# Patient Record
Sex: Male | Born: 1975 | Race: White | Hispanic: No | Marital: Single | Smoking: Former smoker
Health system: Southern US, Community
[De-identification: ages and names within clinical notes are randomized; demographics above are authoritative.]

## PROBLEM LIST (undated history)

## (undated) DIAGNOSIS — I10 Essential (primary) hypertension: Secondary | ICD-10-CM

## (undated) DIAGNOSIS — F419 Anxiety disorder, unspecified: Secondary | ICD-10-CM

## (undated) HISTORY — PX: OTHER SURGICAL HISTORY: SHX169

---

## 2013-11-04 ENCOUNTER — Other Ambulatory Visit: Payer: Self-pay | Admitting: Orthopedic Surgery

## 2013-11-06 ENCOUNTER — Encounter (HOSPITAL_BASED_OUTPATIENT_CLINIC_OR_DEPARTMENT_OTHER): Payer: Self-pay | Admitting: *Deleted

## 2013-11-14 ENCOUNTER — Ambulatory Visit (HOSPITAL_BASED_OUTPATIENT_CLINIC_OR_DEPARTMENT_OTHER)
Admission: RE | Admit: 2013-11-14 | Discharge: 2013-11-14 | Disposition: A | Discharge status: Home / Self Care | Payer: Managed Care, Other (non HMO) | Source: Ambulatory Visit | Attending: Orthopedic Surgery | Admitting: Orthopedic Surgery

## 2013-11-14 ENCOUNTER — Encounter (HOSPITAL_BASED_OUTPATIENT_CLINIC_OR_DEPARTMENT_OTHER): Payer: Managed Care, Other (non HMO) | Admitting: Anesthesiology

## 2013-11-14 ENCOUNTER — Encounter (HOSPITAL_BASED_OUTPATIENT_CLINIC_OR_DEPARTMENT_OTHER): Payer: Self-pay | Admitting: *Deleted

## 2013-11-14 ENCOUNTER — Encounter (HOSPITAL_BASED_OUTPATIENT_CLINIC_OR_DEPARTMENT_OTHER)
Admission: RE | Disposition: A | Discharge status: Home / Self Care | Payer: Self-pay | Source: Ambulatory Visit | Attending: Orthopedic Surgery

## 2013-11-14 ENCOUNTER — Ambulatory Visit (HOSPITAL_BASED_OUTPATIENT_CLINIC_OR_DEPARTMENT_OTHER): Payer: Managed Care, Other (non HMO) | Admitting: Anesthesiology

## 2013-11-14 DIAGNOSIS — Z791 Long term (current) use of non-steroidal anti-inflammatories (NSAID): Secondary | ICD-10-CM | POA: Insufficient documentation

## 2013-11-14 DIAGNOSIS — G5600 Carpal tunnel syndrome, unspecified upper limb: Secondary | ICD-10-CM | POA: Insufficient documentation

## 2013-11-14 DIAGNOSIS — Z6841 Body Mass Index (BMI) 40.0 and over, adult: Secondary | ICD-10-CM | POA: Insufficient documentation

## 2013-11-14 DIAGNOSIS — Z87891 Personal history of nicotine dependence: Secondary | ICD-10-CM | POA: Insufficient documentation

## 2013-11-14 DIAGNOSIS — F1010 Alcohol abuse, uncomplicated: Secondary | ICD-10-CM | POA: Insufficient documentation

## 2013-11-14 HISTORY — PX: CARPAL TUNNEL RELEASE: SHX101

## 2013-11-14 LAB — POCT HEMOGLOBIN-HEMACUE: HEMOGLOBIN: 16.2 g/dL (ref 13.0–17.0)

## 2013-11-14 SURGERY — CARPAL TUNNEL RELEASE
Anesthesia: Monitor Anesthesia Care | Site: Wrist | Laterality: Right

## 2013-11-14 MED ORDER — CEFAZOLIN SODIUM-DEXTROSE 2-3 GM-% IV SOLR
2.0000 g | INTRAVENOUS | Status: AC
  Administered 2013-11-14: 3 g via INTRAVENOUS

## 2013-11-14 MED ORDER — FENTANYL CITRATE 0.05 MG/ML IJ SOLN: 50.0000 ug | INTRAMUSCULAR | Status: DC | PRN

## 2013-11-14 MED ORDER — OXYCODONE HCL 5 MG/5ML PO SOLN: 5.0000 mg | Freq: Once | ORAL | Status: DC | PRN

## 2013-11-14 MED ORDER — MIDAZOLAM HCL 5 MG/5ML IJ SOLN
INTRAMUSCULAR | Status: DC | PRN
  Administered 2013-11-14: 1 mg via INTRAVENOUS

## 2013-11-14 MED ORDER — MIDAZOLAM HCL 2 MG/2ML IJ SOLN: 1.0000 mg | INTRAMUSCULAR | Status: DC | PRN

## 2013-11-14 MED ORDER — CEFAZOLIN SODIUM-DEXTROSE 2-3 GM-% IV SOLR
INTRAVENOUS | Status: AC
  Filled 2013-11-14: qty 50

## 2013-11-14 MED ORDER — BUPIVACAINE HCL (PF) 0.25 % IJ SOLN
INTRAMUSCULAR | Status: DC | PRN
  Administered 2013-11-14: 10 mL

## 2013-11-14 MED ORDER — MIDAZOLAM HCL 2 MG/2ML IJ SOLN
INTRAMUSCULAR | Status: AC
  Filled 2013-11-14: qty 2

## 2013-11-14 MED ORDER — CEFAZOLIN SODIUM 1-5 GM-% IV SOLN
INTRAVENOUS | Status: AC
  Filled 2013-11-14: qty 50

## 2013-11-14 MED ORDER — HYDROCODONE-ACETAMINOPHEN 5-325 MG PO TABS: ORAL_TABLET | ORAL | Status: DC

## 2013-11-14 MED ORDER — OXYCODONE HCL 5 MG PO TABS: 5.0000 mg | ORAL_TABLET | Freq: Once | ORAL | Status: DC | PRN

## 2013-11-14 MED ORDER — PROPOFOL 10 MG/ML IV BOLUS
INTRAVENOUS | Status: AC
  Filled 2013-11-14: qty 20

## 2013-11-14 MED ORDER — LIDOCAINE HCL (CARDIAC) 20 MG/ML IV SOLN
INTRAVENOUS | Status: DC | PRN
  Administered 2013-11-14: 30 mg via INTRAVENOUS

## 2013-11-14 MED ORDER — FENTANYL CITRATE 0.05 MG/ML IJ SOLN
INTRAMUSCULAR | Status: AC
  Filled 2013-11-14: qty 2

## 2013-11-14 MED ORDER — HYDROMORPHONE HCL PF 1 MG/ML IJ SOLN: 0.2500 mg | INTRAMUSCULAR | Status: DC | PRN

## 2013-11-14 MED ORDER — CHLORHEXIDINE GLUCONATE 4 % EX LIQD: 60.0000 mL | Freq: Once | CUTANEOUS | Status: DC

## 2013-11-14 MED ORDER — FENTANYL CITRATE 0.05 MG/ML IJ SOLN
INTRAMUSCULAR | Status: DC | PRN
  Administered 2013-11-14: 50 ug via INTRAVENOUS

## 2013-11-14 MED ORDER — LIDOCAINE HCL (PF) 0.5 % IJ SOLN
INTRAMUSCULAR | Status: DC | PRN
  Administered 2013-11-14: 30 mL via INTRAVENOUS

## 2013-11-14 MED ORDER — ONDANSETRON HCL 4 MG/2ML IJ SOLN
INTRAMUSCULAR | Status: DC | PRN
  Administered 2013-11-14: 4 mg via INTRAVENOUS

## 2013-11-14 MED ORDER — LACTATED RINGERS IV SOLN
INTRAVENOUS | Status: DC
  Administered 2013-11-14: 08:00:00 via INTRAVENOUS

## 2013-11-14 MED ORDER — PROPOFOL INFUSION 10 MG/ML OPTIME
INTRAVENOUS | Status: DC | PRN
  Administered 2013-11-14: 25 ug/kg/min via INTRAVENOUS

## 2013-11-14 SURGICAL SUPPLY — 35 items
BANDAGE ELASTIC 3 VELCRO ST LF (GAUZE/BANDAGES/DRESSINGS) ×3 IMPLANT
BLADE MINI RND TIP GREEN BEAV (BLADE) IMPLANT
BLADE SURG 15 STRL LF DISP TIS (BLADE) ×2 IMPLANT
BLADE SURG 15 STRL SS (BLADE) ×4
BNDG ESMARK 4X9 LF (GAUZE/BANDAGES/DRESSINGS) IMPLANT
BNDG GAUZE ELAST 4 BULKY (GAUZE/BANDAGES/DRESSINGS) ×3 IMPLANT
CHLORAPREP W/TINT 26ML (MISCELLANEOUS) ×3 IMPLANT
CORDS BIPOLAR (ELECTRODE) ×3 IMPLANT
COVER MAYO STAND STRL (DRAPES) ×3 IMPLANT
COVER TABLE BACK 60X90 (DRAPES) ×3 IMPLANT
CUFF TOURNIQUET SINGLE 18IN (TOURNIQUET CUFF) ×3 IMPLANT
DRAPE EXTREMITY T 121X128X90 (DRAPE) ×3 IMPLANT
DRAPE SURG 17X23 STRL (DRAPES) ×3 IMPLANT
DRSG PAD ABDOMINAL 8X10 ST (GAUZE/BANDAGES/DRESSINGS) ×3 IMPLANT
DRSG TEGADERM 2-3/8X2-3/4 SM (GAUZE/BANDAGES/DRESSINGS) ×3 IMPLANT
GAUZE SPONGE 4X4 12PLY STRL (GAUZE/BANDAGES/DRESSINGS) ×3 IMPLANT
GAUZE XEROFORM 1X8 LF (GAUZE/BANDAGES/DRESSINGS) ×3 IMPLANT
GLOVE BIO SURGEON STRL SZ7.5 (GLOVE) ×3 IMPLANT
GLOVE BIOGEL PI IND STRL 8 (GLOVE) ×1 IMPLANT
GLOVE BIOGEL PI INDICATOR 8 (GLOVE) ×2
GLOVE SURG SS PI 7.0 STRL IVOR (GLOVE) ×3 IMPLANT
GOWN STRL REUS W/ TWL LRG LVL3 (GOWN DISPOSABLE) ×1 IMPLANT
GOWN STRL REUS W/TWL LRG LVL3 (GOWN DISPOSABLE) ×2
GOWN STRL REUS W/TWL XL LVL3 (GOWN DISPOSABLE) ×3 IMPLANT
NEEDLE HYPO 25X1 1.5 SAFETY (NEEDLE) ×3 IMPLANT
NS IRRIG 1000ML POUR BTL (IV SOLUTION) ×3 IMPLANT
PACK BASIN DAY SURGERY FS (CUSTOM PROCEDURE TRAY) ×3 IMPLANT
PADDING CAST ABS 4INX4YD NS (CAST SUPPLIES) ×2
PADDING CAST ABS COTTON 4X4 ST (CAST SUPPLIES) ×1 IMPLANT
STOCKINETTE 4X48 STRL (DRAPES) ×3 IMPLANT
SUT ETHILON 4 0 PS 2 18 (SUTURE) ×3 IMPLANT
SYR BULB 3OZ (MISCELLANEOUS) ×3 IMPLANT
SYRINGE CONTROL L 12CC (SYRINGE) ×3 IMPLANT
TOWEL OR 17X24 6PK STRL BLUE (TOWEL DISPOSABLE) ×6 IMPLANT
UNDERPAD 30X30 INCONTINENT (UNDERPADS AND DIAPERS) ×3 IMPLANT

## 2013-11-14 NOTE — Discharge Instructions (Addendum)

## 2013-11-14 NOTE — Op Note (Signed)
NAME:  Ryan Bradley, Ryan Bradley                ACCOUNT NO.:  [id number]  MEDICAL RECORD NO.:  [id number]  LOCATION:                                 FACILITY:  PHYSICIAN:  Betha LoaKevin Dashawna Delbridge, MD             DATE OF BIRTH:  DATE OF PROCEDURE:  11/14/2013 DATE OF DISCHARGE:                              OPERATIVE REPORT   PREOPERATIVE DIAGNOSIS:  Right carpal tunnel syndrome.  POSTOPERATIVE DIAGNOSIS:  Right carpal tunnel syndrome.  PROCEDURE:  Right carpal tunnel release.  SURGEON:  Betha LoaKevin Machell Wirthlin, MD.  ASSISTANT:  None.  ANESTHESIA:  Bier block.  IV FLUIDS:  Per anesthesia flow sheet.  ESTIMATED BLOOD LOSS:  Minimal.  COMPLICATIONS:  None.  SPECIMENS:  None.  TOURNIQUET TIME:  29 minutes.  DISPOSITION:  Stable to PACU.  INDICATIONS:  Mr. Ryan BatonBuckner is a 38 year old male with longstanding diagnosis of carpal tunnel syndrome.  He has pins and needle sensation in his fingers.  He wishes to have a carpal tunnel release for management of symptoms.  Risks, benefits,  and alternatives of surgery were discussed including risk of blood loss, infection, damage to nerves, vessels, tendons, ligaments, bone; failure of surgery; need for additional surgery, complications with wound healing, continued pain, and continued carpal tunnel syndrome.  He voiced understanding and elected to proceed.  OPERATIVE COURSE:  After being identified preoperatively by myself, the patient and I agreed upon procedure site procedure.  Surgical site was marked.  The risks, benefits, and alternatives of the surgery were reviewed and wished to proceed.  Surgical consent had been signed.  He was given IV Ancef preoperative antibiotic prophylaxis.  He was transferred to the operating room, placed on the operating room table in supine position with the right upper extremity on arm board.  Bier block anesthesia was induced by Anesthesiologist.  Right upper extremity was prepped and draped in normal sterile orthopedic fashion.   Surgical pause was performed between surgeons, anesthesia, operating staff, and all were in agreement as to the patient, procedure, and site of procedure. A small dry wound on the dorsum of the hand was covered with an OpSite after prepping.  Incision was made over the transverse carpal ligament. This was carried into subcutaneous tissues by spreading technique. Bipolar electrocautery was used to obtain hemostasis.  The palmar fascia was sharply incised with knife.  Transverse carpal ligament was identified.  It was sharply incised.  It was incised distally first. Care was taken to ensure complete decompression distally.  It was then incised proximally.  Scissors were used to split the distal aspect of volar antebrachial fascia.  Finger was placed into the wound to ensure complete decompression which was the case.  The nerve was examined.  It was slightly flattened.  The motor branch was identified and was intact. The wound was copiously irrigated with sterile saline.  It was then closed with 4-0 nylon in a horizontal mattress fashion.  It had been injected with 10 mL of 0.25% plain Marcaine at the beginning of the case to augment the anesthesia.  The wound was dressed with sterile Xeroform, 4x4s, and ABD and wrapped with Kerlix and Ace  bandage.  Tourniquet was deflated at 29 minutes.  Fingertips were pink with brisk capillary refill after deflation of tourniquet.  Operative drapes were broken down.  The patient was awoken from anesthesia safely.  He was transferred back to stretcher and taken to PACU in stable condition.  I will see him back in the office 1 week for postoperative followup.  I will give him Norco 5/325, 1-2 p.o. q.6 hours p.r.n. pain, dispensed #30.     Betha LoaKevin Bralynn Donado, MD     KK/MEDQ  D:  11/14/2013  T:  11/14/2013  Job:  [id number]

## 2013-11-14 NOTE — Anesthesia Preprocedure Evaluation (Signed)
Anesthesia Evaluation  Patient identified by MRN, date of birth, ID band Patient awake    Reviewed: Allergy & Precautions, H&P , NPO status , Patient's Chart, lab work & pertinent test results  Airway Mallampati: III TM Distance: >3 FB Neck ROM: Full    Dental no notable dental hx. (+) Teeth Intact, Dental Advisory Given   Pulmonary neg pulmonary ROS, former smoker,  breath sounds clear to auscultation  Pulmonary exam normal       Cardiovascular negative cardio ROS  Rhythm:Regular Rate:Normal     Neuro/Psych negative neurological ROS  negative psych ROS   GI/Hepatic negative GI ROS, Neg liver ROS,   Endo/Other  Morbid obesity  Renal/GU negative Renal ROS  negative genitourinary   Musculoskeletal   Abdominal   Peds  Hematology negative hematology ROS (+)   Anesthesia Other Findings   Reproductive/Obstetrics negative OB ROS                           Anesthesia Physical Anesthesia Plan  ASA: III  Anesthesia Plan: MAC and Bier Block   Post-op Pain Management:    Induction: Intravenous  Airway Management Planned: Simple Face Mask  Additional Equipment:   Intra-op Plan:   Post-operative Plan: Extubation in OR  Informed Consent: I have reviewed the patients History and Physical, chart, labs and discussed the procedure including the risks, benefits and alternatives for the proposed anesthesia with the patient or authorized representative who has indicated his/her understanding and acceptance.   Dental advisory given  Plan Discussed with: CRNA  Anesthesia Plan Comments:         Anesthesia Quick Evaluation

## 2013-11-14 NOTE — Op Note (Signed)
[id number] 

## 2013-11-14 NOTE — Brief Op Note (Signed)
11/14/2013  10:14 AM  PATIENT:  Erma HeritageJohn B Speas  38 y.o. male  PRE-OPERATIVE DIAGNOSIS:  RIGHT CARPAL TUNNEL SYNDROME  POST-OPERATIVE DIAGNOSIS:  Right Carpal tunnel syndrome  PROCEDURE:  Procedure(s): RIGHT CARPAL TUNNEL RELEASE (Right)  SURGEON:  Surgeon(s) and Role:    * Tami RibasKevin R Lorri Fukuhara, MD - Primary  PHYSICIAN ASSISTANT:   ASSISTANTS: none   ANESTHESIA:   Bier block  EBL:     BLOOD ADMINISTERED:none  DRAINS: none   LOCAL MEDICATIONS USED:  MARCAINE     SPECIMEN:  No Specimen  DISPOSITION OF SPECIMEN:  N/A  COUNTS:  YES  TOURNIQUET:   Total Tourniquet Time Documented: Forearm (Right) - 29 minutes Total: Forearm (Right) - 29 minutes   DICTATION: .Other Dictation: Dictation Number [id number]  PLAN OF CARE: Discharge to home after PACU  PATIENT DISPOSITION:  PACU - hemodynamically stable.

## 2013-11-14 NOTE — Anesthesia Procedure Notes (Signed)
Anesthesia Regional Block:  Bier block (IV Regional)  Pre-Anesthetic Checklist: ,, timeout performed, Correct Patient, Correct Site, Correct Laterality, Correct Procedure,, site marked, surgical consent,, at surgeon's request Needles:  Injection technique: Single-shot  Needle Type: Other      Needle Gauge: 20 and 20 G    Additional Needles: Bier block (IV Regional) Narrative:   Performed by: Personally    Procedure Name: MAC Date/Time: 11/14/2013 9:30 AM Performed by: Catha Ontko Pre-anesthesia Checklist: Patient identified, Emergency Drugs available, Suction available, Patient being monitored and Timeout performed Patient Re-evaluated:Patient Re-evaluated prior to inductionOxygen Delivery Method: Simple face mask

## 2013-11-14 NOTE — Transfer of Care (Signed)
Immediate Anesthesia Transfer of Care Note  Patient: Erma HeritageJohn B Lahti  Procedure(s) Performed: Procedure(s): RIGHT CARPAL TUNNEL RELEASE (Right)  Patient Location: PACU  Anesthesia Type:Bier block  Level of Consciousness: awake, alert , oriented and patient cooperative  Airway & Oxygen Therapy: Patient Spontanous Breathing and Patient connected to face mask oxygen  Post-op Assessment: Report given to PACU RN and Post -op Vital signs reviewed and stable  Post vital signs: Reviewed and stable  Complications: No apparent anesthesia complications

## 2013-11-14 NOTE — Anesthesia Postprocedure Evaluation (Signed)
  Anesthesia Post-op Note  Patient: Ryan HeritageJohn B Bradley  Procedure(s) Performed: Procedure(s): RIGHT CARPAL TUNNEL RELEASE (Right)  Patient Location: PACU  Anesthesia Type: MAC and Bier block  Level of Consciousness: awake and alert   Airway and Oxygen Therapy: Patient Spontanous Breathing  Post-op Pain: none  Post-op Assessment: Post-op Vital signs reviewed, Patient's Cardiovascular Status Stable and Respiratory Function Stable  Post-op Vital Signs: Reviewed  Filed Vitals:   11/14/13 1059  BP: 141/98  Pulse: 63  Temp: 36.3 C  Resp: 22    Complications: No apparent anesthesia complications

## 2013-11-14 NOTE — H&P (Signed)
  Ryan Bradley is an 38 y.o. Bradley.   Chief Complaint: carpal tunnel HPI: Ryan Bradley with 15+ years carpal tunnel syndrome.  Tingling in thumb, index, long fingers.  Worse when riding motorcycle.  Positive nerve conduction studies.  He wishes to have a carpal tunnel release to manage symptoms.   History reviewed. No pertinent past medical history.  Past Surgical History  Procedure Laterality Date  . Reconstructive surgery  on left hand      2000 [facility]  . Left knee arthroscopy      2006 [facility]    History reviewed. No pertinent family history. Social History:  reports that he quit smoking about 3 months ago. He does not have any smokeless tobacco history on file. He reports that he drinks alcohol. He reports that he does not use illicit drugs.  Allergies:  Allergies  Allergen Reactions  . Other Nausea And Vomiting    Oysters    Medications Prior to Admission  Medication Sig Dispense Refill  . naproxen sodium (ANAPROX) 220 MG tablet Take 220 mg by mouth 3 (three) times daily.        No results found for this or any previous visit (from the past 48 hour(s)).  No results found.   A comprehensive review of systems was negative except for: Eyes: positive for contacts/glasses  Height 6\' 1"  (1.854 m), weight 149.687 kg (330 lb).  General appearance: alert, cooperative and appears stated age Head: Normocephalic, without obvious abnormality, atraumatic Neck: supple, symmetrical, trachea midline Resp: clear to auscultation bilaterally Cardio: regular rate and rhythm GI: non tender Extremities: intact sensation and capillary refill all digits.  +epl/fpl/io.  no wounds. Pulses: 2+ and symmetric Skin: Skin color, texture, turgor normal. No rashes or lesions Neurologic: Grossly normal Incision/Wound: none  Assessment/Plan Carpal tunnel syndrome.  Plan right carpal tunnel release.  Non operative and operative treatment options were discussed with the patient and  patient wishes to proceed with operative treatment. Risks, benefits, and alternatives of surgery were discussed and the patient agrees with the plan of care.   Ryan Bradley R 11/14/2013, 7:29 AM

## 2013-11-18 ENCOUNTER — Encounter (HOSPITAL_BASED_OUTPATIENT_CLINIC_OR_DEPARTMENT_OTHER): Payer: Self-pay | Admitting: Orthopedic Surgery

## 2015-08-21 ENCOUNTER — Encounter (HOSPITAL_COMMUNITY): Payer: Self-pay

## 2015-08-21 ENCOUNTER — Emergency Department (HOSPITAL_COMMUNITY)
Admission: EM | Admit: 2015-08-21 | Discharge: 2015-08-21 | Disposition: A | Discharge status: Home / Self Care | Payer: BLUE CROSS/BLUE SHIELD | Attending: Emergency Medicine | Admitting: Emergency Medicine

## 2015-08-21 ENCOUNTER — Emergency Department (HOSPITAL_COMMUNITY): Payer: BLUE CROSS/BLUE SHIELD

## 2015-08-21 DIAGNOSIS — Z87891 Personal history of nicotine dependence: Secondary | ICD-10-CM | POA: Insufficient documentation

## 2015-08-21 DIAGNOSIS — Z79899 Other long term (current) drug therapy: Secondary | ICD-10-CM | POA: Diagnosis not present

## 2015-08-21 DIAGNOSIS — R079 Chest pain, unspecified: Secondary | ICD-10-CM | POA: Insufficient documentation

## 2015-08-21 HISTORY — DX: Anxiety disorder, unspecified: F41.9

## 2015-08-21 LAB — CBC WITH DIFFERENTIAL/PLATELET
Basophils Absolute: 0 10*3/uL (ref 0.0–0.1)
Basophils Relative: 1 %
Eosinophils Absolute: 0.1 10*3/uL (ref 0.0–0.7)
Eosinophils Relative: 1 %
HCT: 46.9 % (ref 39.0–52.0)
Hemoglobin: 15.8 g/dL (ref 13.0–17.0)
Lymphocytes Relative: 24 %
Lymphs Abs: 1.9 10*3/uL (ref 0.7–4.0)
MCH: 29.4 pg (ref 26.0–34.0)
MCHC: 33.7 g/dL (ref 30.0–36.0)
MCV: 87.3 fL (ref 78.0–100.0)
Monocytes Absolute: 0.8 10*3/uL (ref 0.1–1.0)
Monocytes Relative: 11 %
Neutro Abs: 5 10*3/uL (ref 1.7–7.7)
Neutrophils Relative %: 63 %
Platelets: 239 10*3/uL (ref 150–400)
RBC: 5.37 MIL/uL (ref 4.22–5.81)
RDW: 13.4 % (ref 11.5–15.5)
WBC: 7.7 10*3/uL (ref 4.0–10.5)

## 2015-08-21 LAB — HEPATIC FUNCTION PANEL
ALT: 38 U/L (ref 17–63)
AST: 25 U/L (ref 15–41)
Albumin: 4.3 g/dL (ref 3.5–5.0)
Alkaline Phosphatase: 80 U/L (ref 38–126)
BILIRUBIN DIRECT: 0.1 mg/dL (ref 0.1–0.5)
BILIRUBIN INDIRECT: 0.4 mg/dL (ref 0.3–0.9)
TOTAL PROTEIN: 7.5 g/dL (ref 6.5–8.1)
Total Bilirubin: 0.5 mg/dL (ref 0.3–1.2)

## 2015-08-21 LAB — BASIC METABOLIC PANEL
Anion gap: 7 (ref 5–15)
BUN: 19 mg/dL (ref 6–20)
CALCIUM: 9 mg/dL (ref 8.9–10.3)
CHLORIDE: 98 mmol/L — AB (ref 101–111)
CO2: 30 mmol/L (ref 22–32)
CREATININE: 1.54 mg/dL — AB (ref 0.61–1.24)
GFR calc non Af Amer: 55 mL/min — ABNORMAL LOW (ref >60)
Glucose, Bld: 90 mg/dL (ref 65–99)
Potassium: 4.5 mmol/L (ref 3.5–5.1)
Sodium: 135 mmol/L (ref 135–145)

## 2015-08-21 LAB — TROPONIN I
Troponin I: <0.03 ng/mL
Troponin I: <0.03 ng/mL

## 2015-08-21 MED ORDER — HYDROMORPHONE HCL 1 MG/ML IJ SOLN: 1.0000 mg | Freq: Once | INTRAMUSCULAR | Status: DC

## 2015-08-21 MED ORDER — NITROGLYCERIN 0.4 MG SL SUBL
0.4000 mg | SUBLINGUAL_TABLET | Freq: Once | SUBLINGUAL | Status: AC
  Administered 2015-08-21: 0.4 mg via SUBLINGUAL
  Filled 2015-08-21: qty 1

## 2015-08-21 MED ORDER — HYDROCODONE-ACETAMINOPHEN 5-325 MG PO TABS: 1.0000 | ORAL_TABLET | Freq: Four times a day (QID) | ORAL | Status: DC | PRN

## 2015-08-21 MED ORDER — KETOROLAC TROMETHAMINE 30 MG/ML IJ SOLN: 30.0000 mg | Freq: Once | INTRAMUSCULAR | Status: DC

## 2015-08-21 MED ORDER — ONDANSETRON HCL 4 MG/2ML IJ SOLN
4.0000 mg | Freq: Once | INTRAMUSCULAR | Status: DC
Start: 2015-08-21 — End: 2015-08-21

## 2015-08-21 MED ORDER — OXYCODONE-ACETAMINOPHEN 5-325 MG PO TABS
1.0000 | ORAL_TABLET | Freq: Once | ORAL | Status: AC
  Administered 2015-08-21: 1 via ORAL
  Filled 2015-08-21: qty 1

## 2015-08-21 MED ORDER — LORAZEPAM 1 MG PO TABS
1.0000 mg | ORAL_TABLET | Freq: Once | ORAL | Status: AC
  Administered 2015-08-21: 1 mg via ORAL
  Filled 2015-08-21: qty 1

## 2015-08-21 NOTE — ED Notes (Signed)
Nitro SL did not relief chest pain,  Dr Estell HarpinZammit notified.  Pt did c/o headache from for nitro SL, see new order.

## 2015-08-21 NOTE — ED Provider Notes (Signed)
CSN: [id number]     Arrival date & time 08/21/15  1537 History   First MD Initiated Contact with Patient 08/21/15 1548     Chief Complaint  Patient presents with  . Chest Pain     (Consider location/radiation/quality/duration/timing/severity/associated sxs/prior Treatment) Patient is a 40 y.o. male presenting with chest pain. The history is provided by the patient (Patient complains of some chest tightness for over a day with pain in his left arm not related to exertion.).  Chest Pain Pain location:  L chest Pain quality: aching   Pain radiates to:  Does not radiate Pain radiates to the back: no   Pain severity:  Moderate Onset quality:  Gradual Timing:  Intermittent Progression:  Waxing and waning Chronicity:  New Context: not breathing   Associated symptoms: no abdominal pain, no back pain, no cough, no fatigue and no headache     Past Medical History  Diagnosis Date  . Anxiety    Past Surgical History  Procedure Laterality Date  . Reconstructive surgery  on left hand      2000 [facility]  . Left knee arthroscopy      2006 [facility]  . Carpal tunnel release Right 11/14/2013    Procedure: RIGHT CARPAL TUNNEL RELEASE;  Surgeon: Tami Ribas, MD;  Location: [facility];  Service: Orthopedics;  Laterality: Right;   No family history on file. Social History  Substance Use Topics  . Smoking status: Former Smoker    Quit date: 07/27/2013  . Smokeless tobacco: None     Comment: Smoked a pack a a day  . Alcohol Use: Yes     Comment: Once a week - beer    Review of Systems  Constitutional: Negative for appetite change and fatigue.  HENT: Negative for congestion, ear discharge and sinus pressure.   Eyes: Negative for discharge.  Respiratory: Negative for cough.   Cardiovascular: Positive for chest pain.  Gastrointestinal: Negative for abdominal pain and diarrhea.  Genitourinary: Negative for frequency and hematuria.  Musculoskeletal: Negative for back  pain.  Skin: Negative for rash.  Neurological: Negative for seizures and headaches.  Psychiatric/Behavioral: Negative for hallucinations.      Allergies  Isotretinoin and Other  Home Medications   Prior to Admission medications   Medication Sig Start Date End Date Taking? Authorizing Provider  naproxen sodium (ANAPROX) 220 MG tablet Take 440 mg by mouth every morning.    Yes Historical Provider, MD  sertraline (ZOLOFT) 100 MG tablet Take 100 mg by mouth daily. 06/17/15  Yes Historical Provider, MD  HYDROcodone-acetaminophen (NORCO/VICODIN) 5-325 MG tablet Take 1 tablet by mouth every 6 (six) hours as needed. 08/21/15   Bethann Berkshire, MD   BP 105/55 mmHg  Pulse 56  Temp(Src) 98.2 F (36.8 C) (Oral)  Resp 14  Ht  (1.88 m)  Wt 350 lb (158.759 kg)  BMI 44.92 kg/m2  SpO2 99% Physical Exam  Constitutional: He is oriented to person, place, and time. He appears well-developed.  HENT:  Head: Normocephalic.  Eyes: Conjunctivae and EOM are normal. No scleral icterus.  Neck: Neck supple. No thyromegaly present.  Cardiovascular: Normal rate and regular rhythm.  Exam reveals no gallop and no friction rub.   No murmur heard. Pulmonary/Chest: No stridor. He has no wheezes. He has no rales. He exhibits no tenderness.  Abdominal: He exhibits no distension. There is no tenderness. There is no rebound.  Musculoskeletal: Normal range of motion. He exhibits no edema.  Lymphadenopathy:    He has no cervical adenopathy.  Neurological: He is oriented to person, place, and time. He exhibits normal muscle tone. Coordination normal.  Skin: No rash noted. No erythema.  Psychiatric: He has a normal mood and affect. His behavior is normal.    ED Course  Procedures (including critical care time) Labs Review Labs Reviewed  BASIC METABOLIC PANEL - Abnormal; Notable for the following:    Chloride 98 (*)    Creatinine, Ser 1.54 (*)    GFR calc non Af Amer 55 (*)    All other components within  normal limits  TROPONIN I  CBC WITH DIFFERENTIAL/PLATELET  HEPATIC FUNCTION PANEL  TROPONIN I    Imaging Review Dg Chest 2 View  08/21/2015  CLINICAL DATA:  Chest pain and left arm tingling beginning this morning. No known injury. Initial encounter. EXAM: CHEST  2 VIEW COMPARISON:  None. FINDINGS: The lungs are clear. Heart size is normal. No pneumothorax or pleural effusion. No focal bony abnormality. IMPRESSION: Negative chest. Electronically Signed   By: Drusilla Kannerhomas  Dalessio M.D.   On: 08/21/2015 16:36   I have personally reviewed and evaluated these images and lab results as part of my medical decision-making.   EKG Interpretation   Date/Time:  Friday August 21 2015 20:03:45 EDT Ventricular Rate:  59 PR Interval:  185 QRS Duration: 94 QT Interval:  430 QTC Calculation: 426 R Axis:   101 Text Interpretation:  Sinus rhythm Right axis deviation Low voltage,  precordial leads Confirmed by Ashutosh Dieguez  MD, Aileene Lanum ([id number]) on 08/21/2015  8:18:29 PM      MDM   Final diagnoses:  Chest pain at rest    Labs unremarkable including 2 troponins that were negative. 2 EKGs were normal. Doubt coronary artery disease causing chest pain. Patient referred to a family doctor and to cardiology for further workup. He has a significant family history his brother had an recent bypass    Bethann BerkshireJoseph Arabelle Bollig, MD 08/21/15 2028

## 2015-08-21 NOTE — ED Notes (Addendum)
Patient reports of left sided chest pain with tingling in left arm that started while driving home from work last night.

## 2015-08-21 NOTE — Discharge Instructions (Signed)
Follow up with the cardiologist in 1-2 weeks and follow up with a family md,   Take one baby aspirin a day until you see the cardiologist

## 2015-08-21 NOTE — ED Notes (Signed)
Patient given discharge instruction, verbalized understand. IV removed, band aid applied. Patient ambulatory out of the department.  

## 2017-09-26 IMAGING — DX DG CHEST 2V
3 series · 3 of 3 positions shown · non-contrast
Comparison: None.

CLINICAL DATA: Chest pain and left arm tingling beginning this
morning. No known injury. Initial encounter.

EXAM:
CHEST  2 VIEW

[chest pa (1 of 2)]
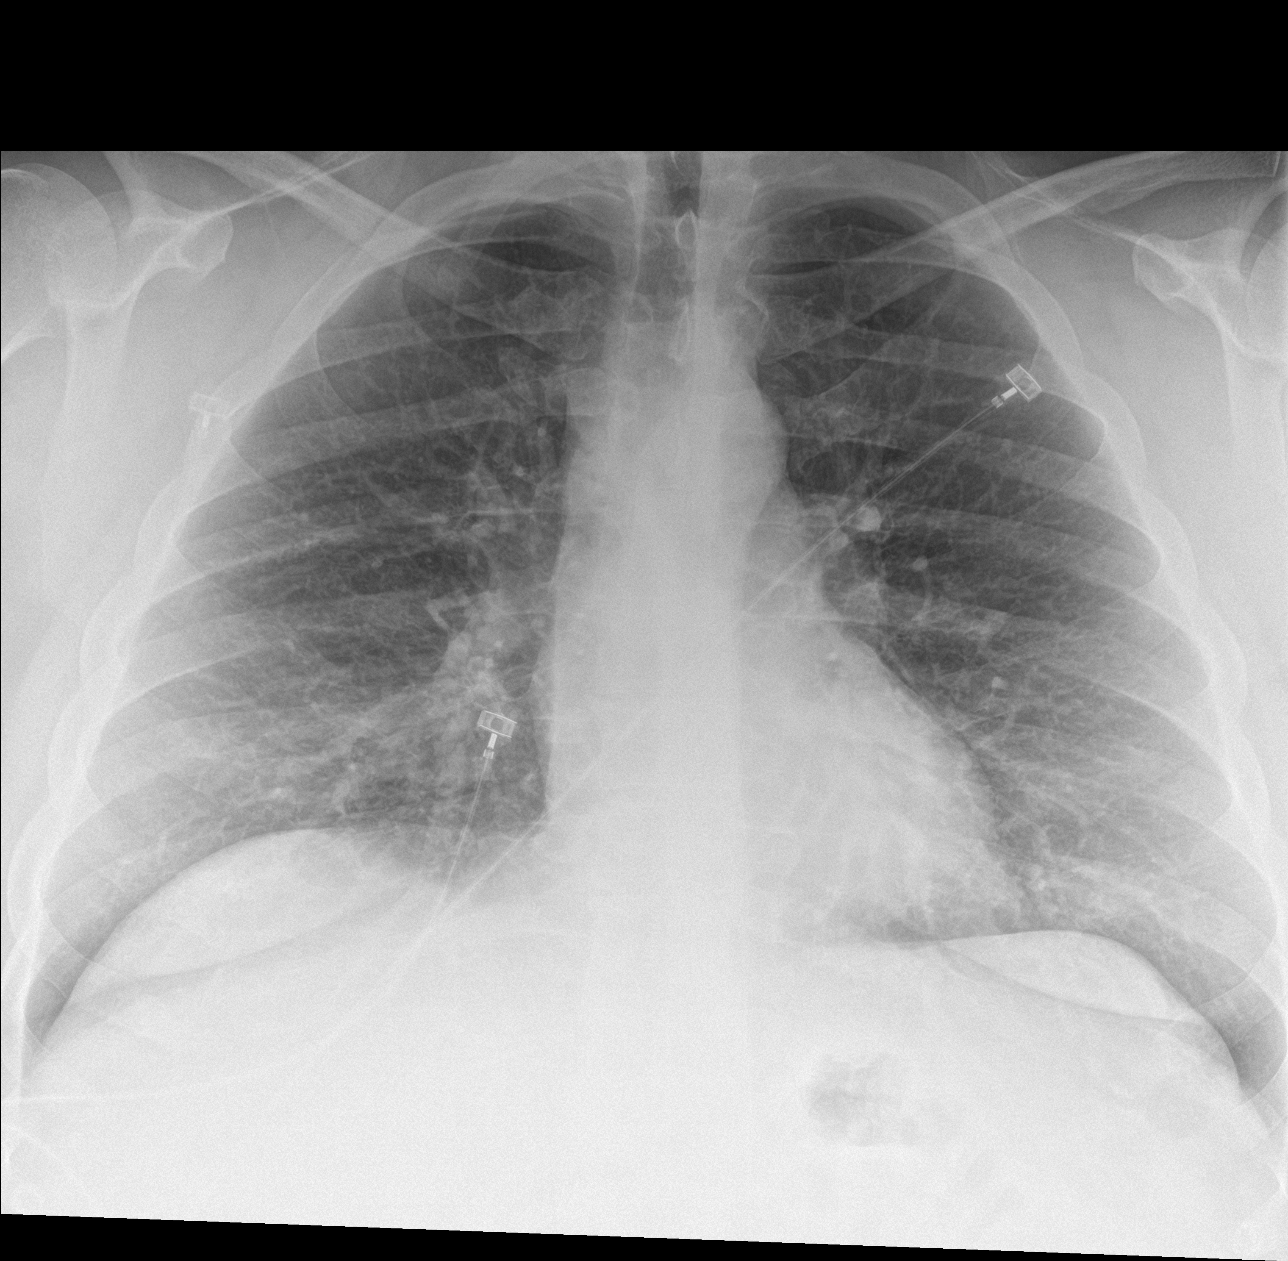

[chest lat]
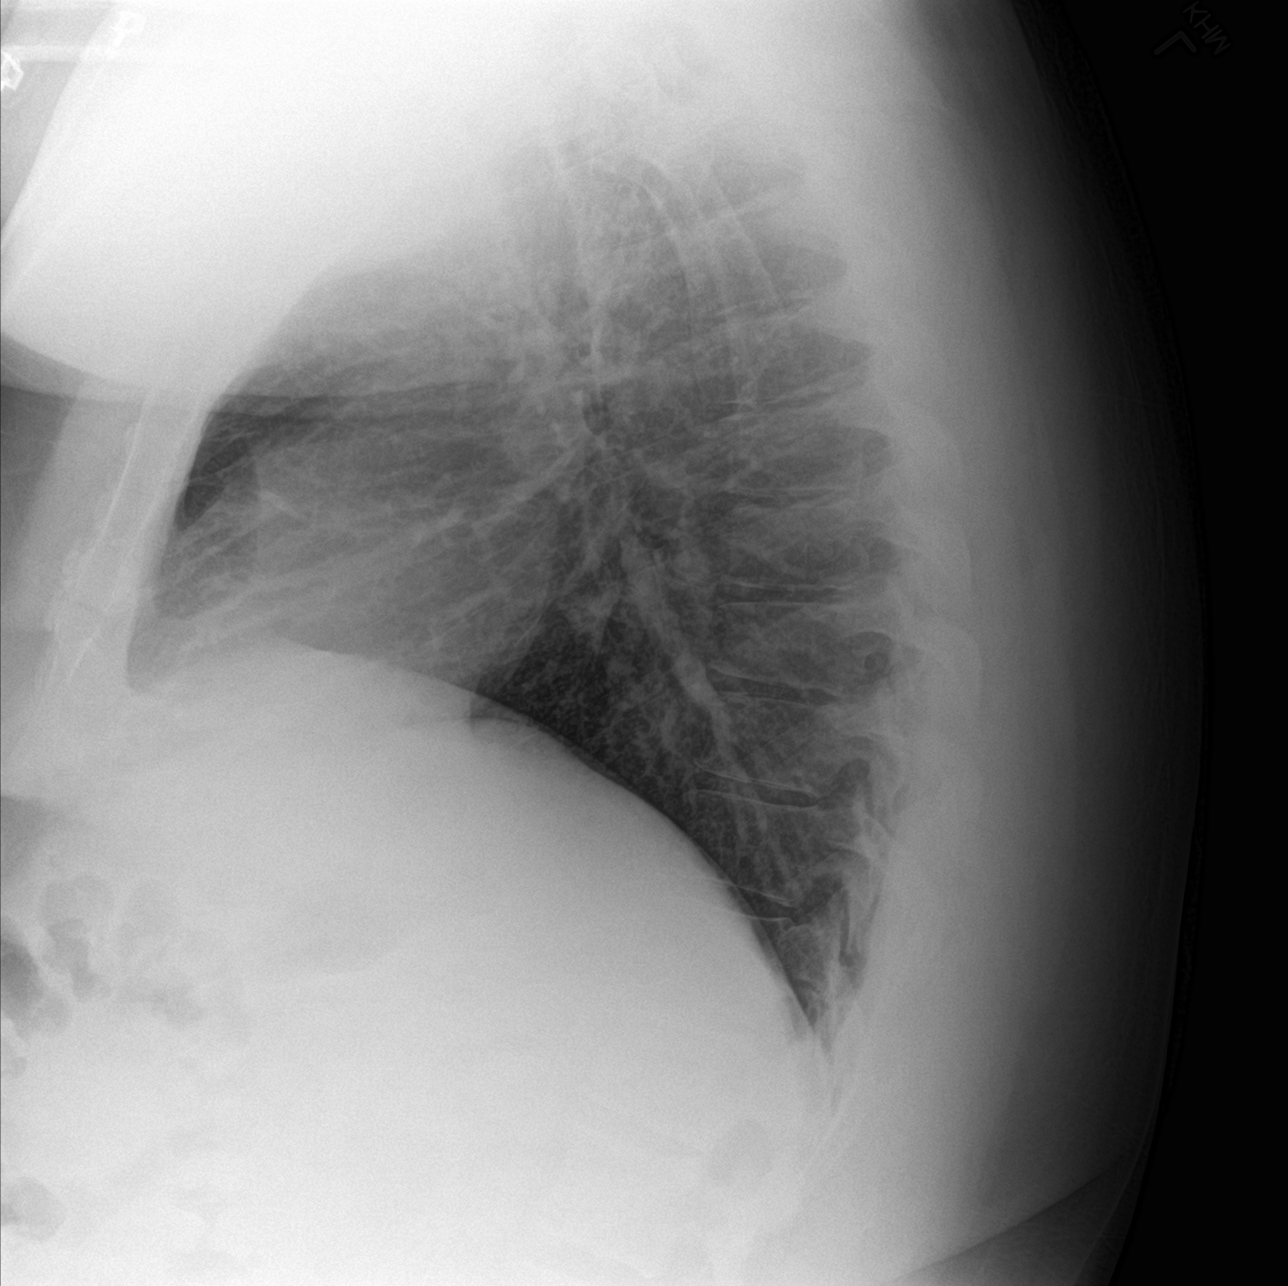

[chest pa (2 of 2)]
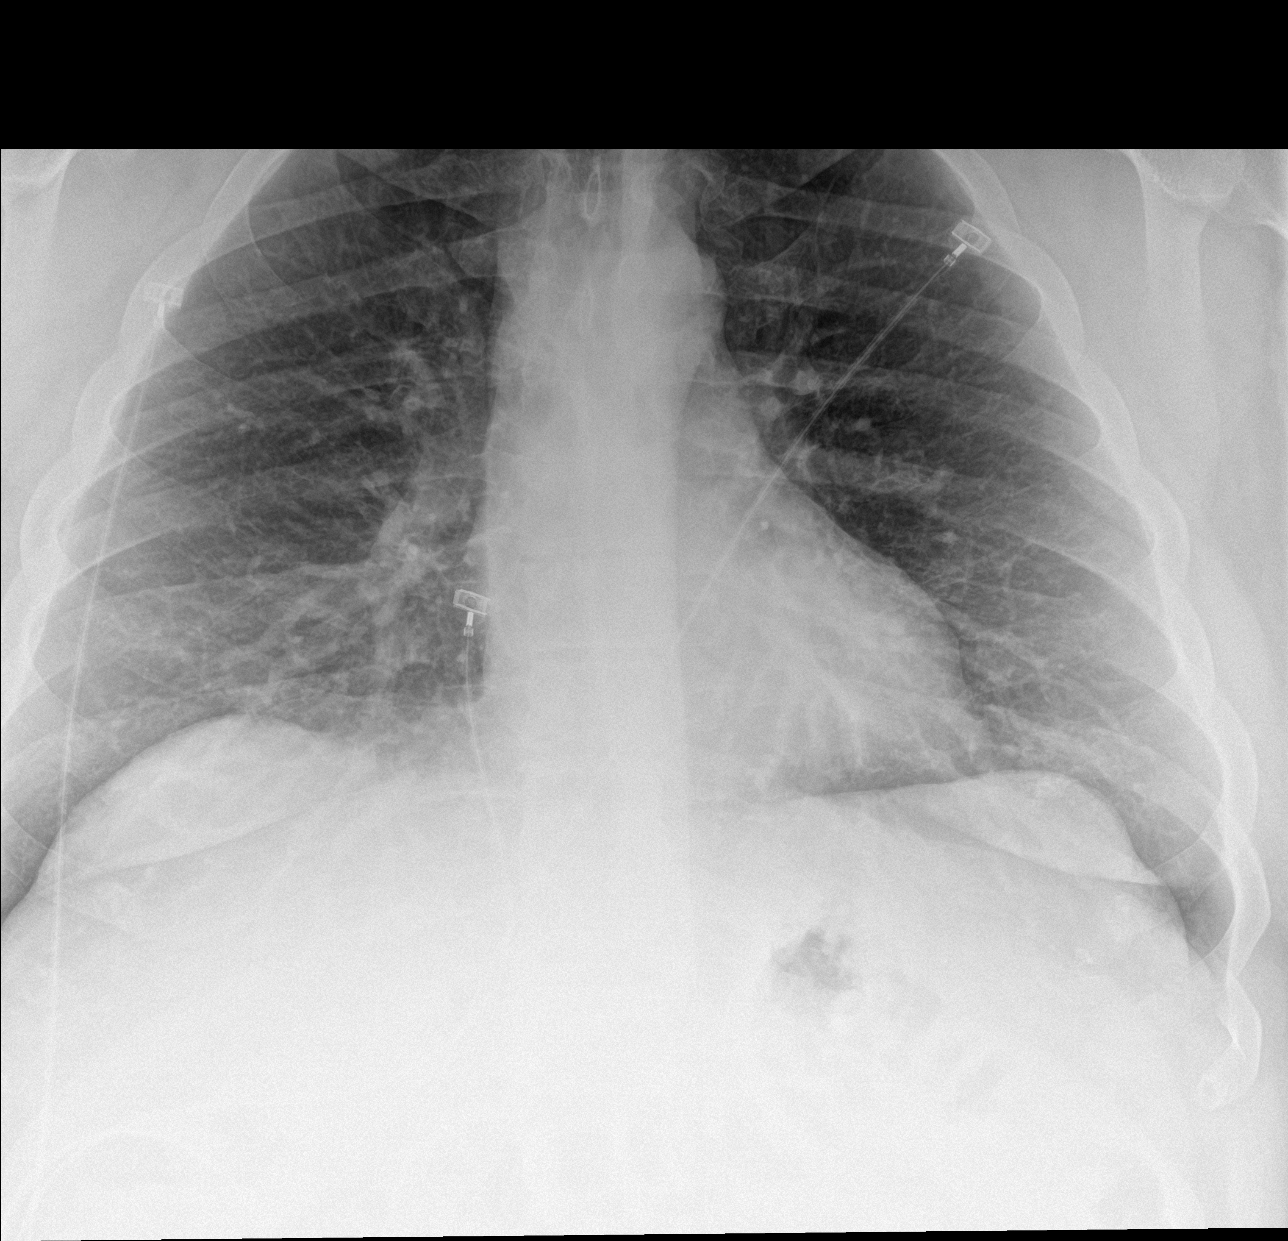

[3 of 3 positions shown; findings below may reference images not displayed]

FINDINGS: The lungs are clear. Heart size is normal. No pneumothorax or
pleural effusion. No focal bony abnormality.
IMPRESSION: Negative chest.

## 2018-04-24 ENCOUNTER — Encounter (HOSPITAL_COMMUNITY): Payer: Self-pay | Admitting: *Deleted

## 2018-04-24 ENCOUNTER — Other Ambulatory Visit: Payer: Self-pay

## 2018-04-24 ENCOUNTER — Emergency Department (HOSPITAL_COMMUNITY)
Admission: EM | Admit: 2018-04-24 | Discharge: 2018-04-24 | Disposition: A | Discharge status: Home / Self Care | Payer: BLUE CROSS/BLUE SHIELD | Attending: Emergency Medicine | Admitting: Emergency Medicine

## 2018-04-24 DIAGNOSIS — Z87891 Personal history of nicotine dependence: Secondary | ICD-10-CM | POA: Insufficient documentation

## 2018-04-24 DIAGNOSIS — R42 Dizziness and giddiness: Secondary | ICD-10-CM | POA: Insufficient documentation

## 2018-04-24 DIAGNOSIS — I10 Essential (primary) hypertension: Secondary | ICD-10-CM | POA: Insufficient documentation

## 2018-04-24 HISTORY — DX: Essential (primary) hypertension: I10

## 2018-04-24 LAB — BASIC METABOLIC PANEL
ANION GAP: 8 (ref 5–15)
BUN: 12 mg/dL (ref 6–20)
CHLORIDE: 102 mmol/L (ref 98–111)
CO2: 27 mmol/L (ref 22–32)
Calcium: 8.9 mg/dL (ref 8.9–10.3)
Creatinine, Ser: 1.1 mg/dL (ref 0.61–1.24)
GFR calc non Af Amer: >60 mL/min (ref >60)
Glucose, Bld: 124 mg/dL — ABNORMAL HIGH (ref 70–99)
POTASSIUM: 3.9 mmol/L (ref 3.5–5.1)
SODIUM: 137 mmol/L (ref 135–145)

## 2018-04-24 LAB — CBC WITH DIFFERENTIAL/PLATELET
Abs Immature Granulocytes: 0.05 10*3/uL (ref 0.00–0.07)
BASOS ABS: 0.1 10*3/uL (ref 0.0–0.1)
Basophils Relative: 1 %
EOS PCT: 0 %
Eosinophils Absolute: 0 10*3/uL (ref 0.0–0.5)
HEMATOCRIT: 42.7 % (ref 39.0–52.0)
HEMOGLOBIN: 13.9 g/dL (ref 13.0–17.0)
IMMATURE GRANULOCYTES: 1 %
LYMPHS ABS: 1.2 10*3/uL (ref 0.7–4.0)
Lymphocytes Relative: 17 %
MCH: 29.1 pg (ref 26.0–34.0)
MCHC: 32.6 g/dL (ref 30.0–36.0)
MCV: 89.3 fL (ref 80.0–100.0)
Monocytes Absolute: 0.6 10*3/uL (ref 0.1–1.0)
Monocytes Relative: 8 %
NRBC: 0 % (ref 0.0–0.2)
Neutro Abs: 5.3 10*3/uL (ref 1.7–7.7)
Neutrophils Relative %: 73 %
Platelets: 206 10*3/uL (ref 150–400)
RBC: 4.78 MIL/uL (ref 4.22–5.81)
RDW: 13.2 % (ref 11.5–15.5)
WBC: 7.2 10*3/uL (ref 4.0–10.5)

## 2018-04-24 LAB — CBG MONITORING, ED: Glucose-Capillary: 148 mg/dL — ABNORMAL HIGH (ref 70–99)

## 2018-04-24 LAB — TROPONIN I

## 2018-04-24 NOTE — ED Provider Notes (Signed)
[facility] EMERGENCY DEPARTMENT Provider Note   CSN: [id number] Arrival date & time: 04/24/18  0414     History   Chief Complaint Chief Complaint  Patient presents with  . Hypertension    HPI Ryan HeritageJohn B Bradley is a 42 y.o. male.  HPI  This a 42 year old male who presents with lightheadedness and high blood pressure.  Patient reports that he was taking a break while at work.  He had sudden onset of lightheadedness and dizziness.  He took his blood pressure and it was over 200 systolic.  He does take blood pressure medications and reports compliance.  He did not experience any chest pain, shortness of breath, headache, weakness, numbness, tingling, strokelike symptoms during this time.  He describes the lightheadedness as off-balance.  He denies any room spinning dizziness.  Denies any significant exertion prior to this.  Reports that he feels that he is hydrated.  Currently he states he has some mild dizziness but otherwise is back to baseline.  Past Medical History:  Diagnosis Date  . Anxiety   . Hypertension     There are no active problems to display for this patient.   Past Surgical History:  Procedure Laterality Date  . CARPAL TUNNEL RELEASE Right 11/14/2013   Procedure: RIGHT CARPAL TUNNEL RELEASE;  Surgeon: Tami RibasKevin R Kuzma, MD;  Location: [facility];  Service: Orthopedics;  Laterality: Right;  . left knee Arthroscopy     2006 [facility] [place]  . Reconstructive surgery  on left hand     2000 - [facility]  . scalp surgery          Home Medications    Prior to Admission medications   Medication Sig Start Date End Date Taking? Authorizing Provider  HYDROcodone-acetaminophen (NORCO/VICODIN) 5-325 MG tablet Take 1 tablet by mouth every 6 (six) hours as needed. 08/21/15   Bethann BerkshireZammit, Joseph, MD  naproxen sodium (ANAPROX) 220 MG tablet Take 440 mg by mouth every morning.     [provider]  sertraline (ZOLOFT) 100 MG tablet Take 100 mg by mouth daily.  06/17/15   [provider]    Family History History reviewed. No pertinent family history.  Social History Social History   Tobacco Use  . Smoking status: Former Smoker    Last attempt to quit: 07/27/2013    Years since quitting: 4.7  . Smokeless tobacco: Never Used  . Tobacco comment: Smoked a pack a a day  Substance Use Topics  . Alcohol use: Yes    Comment: Once a week - beer  . Drug use: No     Allergies   Isotretinoin and Other   Review of Systems Review of Systems  Constitutional: Negative for fever.  Respiratory: Negative for shortness of breath.   Cardiovascular: Negative for chest pain.  Gastrointestinal: Negative for abdominal pain, nausea and vomiting.  Genitourinary: Negative for dysuria.  Neurological: Positive for dizziness and light-headedness. Negative for speech difficulty and weakness.  All other systems reviewed and are negative.    Physical Exam Updated Vital Signs BP (!) 152/94 (BP Location: Right Arm)   Pulse 64   Temp 97.9 F (36.6 C) (Oral)   Resp 16   Ht 1.88 m (6\' 2" )   Wt (!) 179.2 kg   SpO2 96%   BMI 50.71 kg/m   Physical Exam  Constitutional: He is oriented to person, place, and time. He appears well-developed and well-nourished.  Morbidly obese  HENT:  Head: Normocephalic and atraumatic.  Eyes: Pupils are equal, round, and reactive to light. EOM are normal.  Neck: Neck supple.  Cardiovascular: Normal rate, regular rhythm and normal heart sounds.  No murmur heard. Pulmonary/Chest: Effort normal and breath sounds normal. No respiratory distress. He has no wheezes.  Abdominal: Soft. Bowel sounds are normal. There is no tenderness. There is no rebound.  Musculoskeletal: He exhibits no edema.  Neurological: He is alert and oriented to person, place, and time.  Cranial nerves II through XII intact, fluent speech, 5 out of 5 strength in all 4 extremities, no dysmetria to finger-nose-finger  Skin: Skin is warm and dry. He  is not diaphoretic.  Psychiatric: He has a normal mood and affect.  Nursing note and vitals reviewed.    ED Treatments / Results  Labs (all labs ordered are listed, but only abnormal results are displayed) Labs Reviewed  BASIC METABOLIC PANEL - Abnormal; Notable for the following components:      Result Value   Glucose, Bld 124 (*)    All other components within normal limits  CBG MONITORING, ED - Abnormal; Notable for the following components:   Glucose-Capillary 148 (*)    All other components within normal limits  CBC WITH DIFFERENTIAL/PLATELET  TROPONIN I    EKG EKG Interpretation  Date/Time:  Tuesday April 24 2018 05:08:54 EST Ventricular Rate:  61 PR Interval:    QRS Duration: 93 QT Interval:  406 QTC Calculation: 409 R Axis:   75 Text Interpretation:  Sinus rhythm Low voltage, precordial leads Confirmed by Ross Marcus ([id number]) on 04/24/2018 5:12:02 AM   Radiology No results found.  Procedures Procedures (including critical care time)  Medications Ordered in ED Medications - No data to display   Initial Impression / Assessment and Plan / ED Course  I have reviewed the triage vital signs and the nursing notes.  Pertinent labs & imaging results that were available during my care of the patient were reviewed by me and considered in my medical decision making (see chart for details).     Patient presents with lightheadedness and hypertension.  Symptoms have improved and his blood pressure has down trended.  He is nontoxic-appearing.  Vital signs notable for blood pressure of 152/94.  He has no signs or symptoms of cerebellar dysfunction and his neurologic exam is benign.  He has not had any chest pain or shortness of breath.  EKG shows no evidence of arrhythmia or ischemia.  He is not orthostatic.  CBG is 148.  Patient monitored closely.  Other lab work is largely reassuring.  He has remained hemodynamically stable while in the emergency room.  Unclear  etiology of the symptoms although a sudden spike of his blood pressure could have caused dizziness.  At this time he does not appear to have an acute emergent process.  Doubt ACS or arrhythmia as the cause.  However, would have him follow-up closely with his cardiologist for blood pressure recheck and any adjustments in medications.  After history, exam, and medical workup I feel the patient has been appropriately medically screened and is safe for discharge home. Pertinent diagnoses were discussed with the patient. Patient was given return precautions.   Final Clinical Impressions(s) / ED Diagnoses   Final diagnoses:  Essential hypertension  Dizziness    ED Discharge Orders    None       Shon Baton, MD 04/24/18 [id number]

## 2018-04-24 NOTE — ED Triage Notes (Signed)
Pt c/o feeling dizzy and lightheaded and states while he was at work this evening on break he started to sweat and have a feeling of "not feeling well"; pt denies any pain at this time

## 2018-04-24 NOTE — Discharge Instructions (Addendum)
You were seen today for lightheadedness and high blood pressure.  Your work-up today is reassuring.  Follow-up closely with your cardiologist for blood pressure recheck and medication adjustments.  Avoid excessive caffeine or overheating.  If you develop chest pain, shortness of breath, any new or worsening symptoms you need to be reevaluated immediately.

## 2022-02-03 ENCOUNTER — Encounter: Payer: Self-pay | Admitting: Adult Health

## 2022-02-03 ENCOUNTER — Ambulatory Visit: Payer: Medicare Other | Admitting: Adult Health

## 2022-02-03 VITALS — BP 155/94 | HR 66 | Ht 73.0 in | Wt >= 6400 oz

## 2022-02-03 DIAGNOSIS — F411 Generalized anxiety disorder: Secondary | ICD-10-CM | POA: Diagnosis not present

## 2022-02-03 DIAGNOSIS — F331 Major depressive disorder, recurrent, moderate: Secondary | ICD-10-CM | POA: Diagnosis not present

## 2022-02-03 DIAGNOSIS — F410 Panic disorder [episodic paroxysmal anxiety] without agoraphobia: Secondary | ICD-10-CM

## 2022-02-03 MED ORDER — HYDROXYZINE HCL 25 MG PO TABS: 25.0000 mg | ORAL_TABLET | Freq: Three times a day (TID) | ORAL | 0 refills | Status: DC | PRN

## 2022-02-03 NOTE — Progress Notes (Signed)
[facility] MD/PA/NP Initial Note  02/03/2022 4:24 PM CICERO NOY III  MRN:  [id number]  Chief Complaint:   HPI:   Patient seen today for initial psychiatric evaluation.   Referred by PCP  Previously seen by psychiatry.  Reports mood symptoms started in 2019. Stating "I was sitting at work and felt it come over me". Reports severe anxiety and panic attacks . Eventually had to leave the work setting due to the anxiety and depression and now receives disability benefits. Lives alone in a house "out in the woods" with his dog "Charlie". Feels lonely and isolated. Describes mood today as "not good". Pleasant. Denies tearfulness. Mood symptoms - reports depression, anxiety and irritability. Rating anxiety at a 10 or more "all the time". Mood is lower. Depression has gradually increased. Denies worry or over thinking. Reports panic attacks. Feels emotionless - nothing gets me excited - things I used to love, I don't care a thing about. Stating "I feel isolated". Lives alone out in the woods. Has friends he stays in contact with. Goes out to eat every Wednesday to the same place - feels comfortable there. Most recently seen by PCP - referring Provider. [facility] now at [place] in [facility]. Also reports seeing a therapist - "not very helpful". Reports multiple medication trials without any symptom relief. Also reports 36 treatments of [place] - "also not helpful". Stating "I want to feel better - I don't enjoy anything anymore". Willing to consider other option for treatment. Decreased interest and motivation. Taking medications as prescribed.  Energy levels lower. Active, does not have a regular exercise routine. Enjoys some usual interests and activities. Single. Lives alone with dog "Charlie". Family local. Appetite decreased - eating once a day. Weight gain.. Sleeps well most nights. Averages 8 to 10 hours. Uses a CPAP machine. Focus and  concentration difficulties. Completing tasks. Managing aspects of household. Disabled since - September of 2021. Denies SI or HI.  Denies AH or VH. Denies self harm. Using THC 3 times a week. Denies alcohol use x 4 months.  Previous medication trials:  Lexapro, Zoloft, Cymbalta, Gabapentin, Methylphenidate ER 20, Wellbutrin XL, Effexor, VraylarViibryd, Clonazepam, Ambien.  Visit Diagnosis:    ICD-10-CM   1. Major depressive disorder, recurrent episode, moderate (HCC)  F33.1     2. Generalized anxiety disorder  F41.1 hydrOXYzine (ATARAX) 25 MG tablet    3. Panic attacks  F41.0 hydrOXYzine (ATARAX) 25 MG tablet      Past Psychiatric History: Will request records - no previous hospital admissions    Past Medical History:  Past Medical History:  Diagnosis Date   Anxiety    Hypertension     Past Surgical History:  Procedure Laterality Date   CARPAL TUNNEL RELEASE Right 11/14/2013   Procedure: RIGHT CARPAL TUNNEL RELEASE;  Surgeon: Tennis Must, MD;  Location: [facility];  Service: Orthopedics;  Laterality: Right;   left knee Arthroscopy     2006 - [facility]   Reconstructive surgery  on left hand     2000 - [facility]   scalp surgery      Family Psychiatric History: Family history of mental illness.   Family History: No family history on file.  Social History:  Social History   Socioeconomic History   Marital status: Single    Spouse name: Not on file   Number of children: Not on file   Years of education: Not on file  Highest education level: Not on file  Occupational History   Not on file  Tobacco Use   Smoking status: Former    Types: Cigarettes    Quit date: 07/27/2013    Years since quitting: 8.5   Smokeless tobacco: Never   Tobacco comments:    Smoked a pack a a day  Substance and Sexual Activity   Alcohol use: Yes    Comment: Once a week - beer   Drug use: No   Sexual activity: Not on file  Other Topics Concern   Not on file  Social  History Narrative   Not on file   Social Determinants of Health   Financial Resource Strain: Not on file  Food Insecurity: Not on file  Transportation Needs: Not on file  Physical Activity: Not on file  Stress: Not on file  Social Connections: Not on file    Allergies:  Allergies  Allergen Reactions   Isotretinoin Other (See Comments)   Other Nausea And Vomiting    Oysters Arts development officer     Metabolic Disorder Labs: No results found for: "HGBA1C", "MPG" No results found for: "PROLACTIN" No results found for: "CHOL", "TRIG", "HDL", "CHOLHDL", "VLDL", "LDLCALC" No results found for: "TSH"  Therapeutic Level Labs: No results found for: "LITHIUM" No results found for: "VALPROATE" No results found for: "CBMZ"  Current Medications: Current Outpatient Medications  Medication Sig Dispense Refill   hydrOXYzine (ATARAX) 25 MG tablet Take 1 tablet (25 mg total) by mouth 3 (three) times daily as needed. 30 tablet 0   amLODipine (NORVASC) 10 MG tablet Take 10 mg by mouth at bedtime.     aspirin EC 81 MG tablet Take by mouth.     carvedilol (COREG) 25 MG tablet Take 25 mg by mouth 2 (two) times daily.     HYDROcodone-acetaminophen (NORCO/VICODIN) 5-325 MG tablet Take 1 tablet by mouth every 6 (six) hours as needed. 15 tablet 0   naproxen sodium (ALEVE) 220 MG tablet Take by mouth.     naproxen sodium (ANAPROX) 220 MG tablet Take 440 mg by mouth every morning.      rosuvastatin (CRESTOR) 40 MG tablet Take 40 mg by mouth daily.     No current facility-administered medications for this visit.    Medication Side Effects: none  Orders placed this visit:  No orders of the defined types were placed in this encounter.   Psychiatric Specialty Exam:  Review of Systems  Musculoskeletal:  Negative for gait problem.  Neurological:  Negative for tremors.  Psychiatric/Behavioral:         Please refer to HPI    Blood pressure (!) 155/94, pulse 66, height 6\' 1"  (1.854 m),  weight (!) 400 lb (181.4 kg).Body mass index is 52.77 kg/m.  General Appearance: Casual and Neat  Eye Contact:  Good  Speech:  Clear and Coherent and Normal Rate  Volume:  Normal  Mood:  Anxious and Depressed  Affect:  Appropriate and Congruent  Thought Process:  Coherent and Descriptions of Associations: Intact  Orientation:  Full (Time, Place, and Person)  Thought Content: Logical   Suicidal Thoughts:  No  Homicidal Thoughts:  No  Memory:  WNL  Judgement:  Good  Insight:  Good  Psychomotor Activity:  Normal  Concentration:  Concentration: Fair and Attention Span: Fair  Recall:  Good  Fund of Knowledge: Good  Language: Good  Assets:  Communication Skills Desire for Improvement Financial Resources/Insurance Housing Intimacy Leisure Time Physical Health Resilience  Social Support Energy manager  ADL's:  Intact  Cognition: WNL  Prognosis:  Good   Screenings: MDQ  Receiving Psychotherapy: No   Treatment Plan/Recommendations:   Plan:  PDMP reviewed  Add Hydroxyzine 25mg  TID sleep/anxiety    Request previous medical records for medication clarity - Gene Sight testing results.   Time spent with patient was 60 minutes. Greater than 50% of face to face time with patient was spent on counseling and coordination of care.    RTC 4 weeks  Patient advised to contact office with any questions, adverse effects, or acute worsening in signs and symptoms.      , NP

## 2022-03-03 ENCOUNTER — Telehealth: Payer: Medicare Other | Admitting: Adult Health

## 2022-03-04 ENCOUNTER — Telehealth: Payer: Medicare Other | Admitting: Adult Health

## 2022-03-04 ENCOUNTER — Encounter: Payer: Self-pay | Admitting: Adult Health

## 2022-03-04 DIAGNOSIS — F411 Generalized anxiety disorder: Secondary | ICD-10-CM | POA: Diagnosis not present

## 2022-03-04 DIAGNOSIS — F331 Major depressive disorder, recurrent, moderate: Secondary | ICD-10-CM

## 2022-03-04 DIAGNOSIS — F902 Attention-deficit hyperactivity disorder, combined type: Secondary | ICD-10-CM | POA: Diagnosis not present

## 2022-03-04 DIAGNOSIS — F410 Panic disorder [episodic paroxysmal anxiety] without agoraphobia: Secondary | ICD-10-CM

## 2022-03-04 MED ORDER — HYDROXYZINE HCL 25 MG PO TABS: 25.0000 mg | ORAL_TABLET | Freq: Three times a day (TID) | ORAL | 0 refills | Status: DC | PRN

## 2022-03-04 MED ORDER — REXULTI 0.5 MG PO TABS: 0.5000 mg | ORAL_TABLET | Freq: Every day | ORAL | 2 refills | Status: DC

## 2022-03-04 NOTE — Progress Notes (Signed)
Ryan Bradley [id number] September 10, 1975 46 y.o.  Virtual Visit via Video Note  I connected with pt @ on 03/04/22 at  4:20 PM EDT by a video enabled telemedicine application and verified that I am speaking with the correct person using two identifiers.   I discussed the limitations of evaluation and management by telemedicine and the availability of in person appointments. The patient expressed understanding and agreed to proceed.  I discussed the assessment and treatment plan with the patient. The patient was provided an opportunity to ask questions and all were answered. The patient agreed with the plan and demonstrated an understanding of the instructions.   The patient was advised to call back or seek an in-person evaluation if the symptoms worsen or if the condition fails to improve as anticipated.  I provided 25 minutes of non-face-to-face time during this encounter.  The patient was located at home.  The provider was located at [facility].   Dorothyann Gibbs, NP   Subjective:   Patient ID:  Ryan Bradley is a 46 y.o. (DOB [birth year]) male.  Chief Complaint: No chief complaint on file.   HPI Ryan Bradley presents for follow-up of MDD, GAD, ADHD and panic attacks.  Previous hx:  Reports mood symptoms started in 2019. Stating "I was sitting at work and felt it come over me". Reports severe anxiety and panic attacks . Eventually had to leave the work setting due to the anxiety and depression and now receives disability benefits.   Describes mood today as "not too good". Pleasant. Denies tearfulness. Mood symptoms - reports depression anxiety and irritability - "all the time". Stating "I don't want to be around myself most days". Reports worry and over thinking. Reports one recent panic attacks - at [facility] - felt overwhelmed. Has things he needs to do and get done, but he shuts down when thinking about having to do them. Lives alone out in the woods - trying  to go out and do something every day. Goes out to eat every Wednesday with friends. Willing to consider other option for treatment. Decreased interest and motivation. Taking medications as prescribed.  Energy levels lower. Active, does not have a regular exercise routine. Walking. Enjoys some usual interests and activities. Single. Lives alone with dog "Charlie". Family local. Appetite decreased - eating once a day. Weight gain. Sleeps well most nights. Averages 8 to 10 hours. Uses a CPAP machine. Focus and concentration difficulties. Completing tasks. Managing aspects of household. Disabled since - September of 2021. Denies SI or HI.  Denies AH or VH. Denies self harm. Using THC 3/4 times a week. Denies alcohol use.  Previous medication trials:  Prozac, Celexa, Pristiq, Trintellix, Paxil, Adderall XR, Vyvanse, Lexapro, Zoloft, Cymbalta, Gabapentin, Methylphenidate ER 20, Wellbutrin XL, Effexor, Vraylar, Lithium Viibryd, Clonazepam, Ambien.   Review of Systems:  Review of Systems  Musculoskeletal:  Negative for gait problem.  Neurological:  Negative for tremors.  Psychiatric/Behavioral:         Please refer to HPI    Medications: I have reviewed the patient's current medications.  Current Outpatient Medications  Medication Sig Dispense Refill   amLODipine (NORVASC) 10 MG tablet Take 10 mg by mouth at bedtime.     aspirin EC 81 MG tablet Take by mouth.     carvedilol (COREG) 25 MG tablet Take 25 mg by mouth 2 (two) times daily.     HYDROcodone-acetaminophen (NORCO/VICODIN) 5-325 MG tablet Take 1 tablet by mouth every  6 (six) hours as needed. 15 tablet 0   hydrOXYzine (ATARAX) 25 MG tablet Take 1 tablet (25 mg total) by mouth 3 (three) times daily as needed. 30 tablet 0   naproxen sodium (ALEVE) 220 MG tablet Take by mouth.     naproxen sodium (ANAPROX) 220 MG tablet Take 440 mg by mouth every morning.      rosuvastatin (CRESTOR) 40 MG tablet Take 40 mg by mouth daily.     No current  facility-administered medications for this visit.    Medication Side Effects: None  Allergies:  Allergies  Allergen Reactions   Isotretinoin Other (See Comments)   Other Nausea And Vomiting    Oysters Oysters   [employer]     Past Medical History:  Diagnosis Date   Anxiety    Hypertension     No family history on file.  Social History   Socioeconomic History   Marital status: Single    Spouse name: Not on file   Number of children: Not on file   Years of education: Not on file   Highest education level: Not on file  Occupational History   Not on file  Tobacco Use   Smoking status: Former    Types: Cigarettes    Quit date: 07/27/2013    Years since quitting: 8.6   Smokeless tobacco: Never   Tobacco comments:    Smoked a pack a a day  Substance and Sexual Activity   Alcohol use: Yes    Comment: Once a week - beer   Drug use: No   Sexual activity: Not on file  Other Topics Concern   Not on file  Social History Narrative   Not on file   Social Determinants of Health   Financial Resource Strain: Not on file  Food Insecurity: Not on file  Transportation Needs: Not on file  Physical Activity: Not on file  Stress: Not on file  Social Connections: Not on file  Intimate Partner Violence: Not on file    Past Medical History, Surgical history, Social history, and Family history were reviewed and updated as appropriate.   Please see review of systems for further details on the patient's review from today.   Objective:   Physical Exam:  There were no vitals taken for this visit.  Physical Exam Constitutional:      General: He is not in acute distress. Musculoskeletal:        General: No deformity.  Neurological:     Mental Status: He is alert and oriented to person, place, and time.     Coordination: Coordination normal.  Psychiatric:        Attention and Perception: Attention and perception normal. He does not perceive auditory or visual  hallucinations.        Mood and Affect: Mood normal. Mood is not anxious or depressed. Affect is not labile, blunt, angry or inappropriate.        Speech: Speech normal.        Behavior: Behavior normal.        Thought Content: Thought content normal. Thought content is not paranoid or delusional. Thought content does not include homicidal or suicidal ideation. Thought content does not include homicidal or suicidal plan.        Cognition and Memory: Cognition and memory normal.        Judgment: Judgment normal.     Comments: Insight intact     Lab Review:     Component Value Date/Time  NA 137 04/24/2018 0541   K 3.9 04/24/2018 0541   CL 102 04/24/2018 0541   CO2 27 04/24/2018 0541   GLUCOSE 124 (H) 04/24/2018 0541   BUN 12 04/24/2018 0541   CREATININE 1.10 04/24/2018 0541   CALCIUM 8.9 04/24/2018 0541   PROT 7.5 08/21/2015 1551   ALBUMIN 4.3 08/21/2015 1551   AST 25 08/21/2015 1551   ALT 38 08/21/2015 1551   ALKPHOS 80 08/21/2015 1551   BILITOT 0.5 08/21/2015 1551   GFRNONAA >60 04/24/2018 0541   GFRAA >60 04/24/2018 0541       Component Value Date/Time   WBC 7.2 04/24/2018 0541   RBC 4.78 04/24/2018 0541   HGB 13.9 04/24/2018 0541   HCT 42.7 04/24/2018 0541   PLT 206 04/24/2018 0541   MCV 89.3 04/24/2018 0541   MCH 29.1 04/24/2018 0541   MCHC 32.6 04/24/2018 0541   RDW 13.2 04/24/2018 0541   LYMPHSABS 1.2 04/24/2018 0541   MONOABS 0.6 04/24/2018 0541   EOSABS 0.0 04/24/2018 0541   BASOSABS 0.1 04/24/2018 0541    No results found for: "POCLITH", "LITHIUM"   No results found for: "PHENYTOIN", "PHENOBARB", "VALPROATE", "CBMZ"   .res Assessment: Plan:    Plan:  PDMP reviewed  Add Rexulti 0.5mg  daily Continue Hydroxyzine 25mg  TID sleep/anxiety    Request previous medical records for medication clarity - Gene Sight testing results.  Time spent with patient was 60 minutes. Greater than 50% of face to face time with patient was spent on counseling and  coordination of care.    RTC 4 weeks  Patient advised to contact office with any questions, adverse effects, or acute worsening in signs and symptoms.  Diagnoses and all orders for this visit:  Major depressive disorder, recurrent episode, moderate (HCC)  Generalized anxiety disorder  Panic attacks  Attention deficit hyperactivity disorder (ADHD), combined type     Please see After Visit Summary for patient specific instructions.  No future appointments.  No orders of the defined types were placed in this encounter.     -------------------------------

## 2022-03-04 NOTE — Progress Notes (Signed)
Ryan Bradley Bradley [id number] [birth year] 46 y.o.  Subjective:   Patient ID:  Ryan Bradley is a 46 y.o. (DOB 1975/06/25) male.  Chief Complaint: No chief complaint on file.   HPI Bertrum Sol Bradley presents to the office today for follow-up of MDD, GAD, ADHD and panic attacks.  Reports mood symptoms started in 2019. Stating "I was sitting at work and felt it come over me". Reports severe anxiety and panic attacks . Eventually had to leave the work setting due to the anxiety and depression and now receives disability benefits. Lives alone in a house "out in the woods" with his dog "Charlie". Feels lonely and isolated. Describes mood today as "not good". Pleasant. Denies tearfulness. Mood symptoms - reports depression, anxiety and irritability. Rating anxiety at a 10 or more "all the time". Mood is lower. Depression has gradually increased. Denies worry or over thinking. Reports panic attacks. Feels emotionless - nothing gets me excited - things I used to love, I don't care a thing about. Stating "I feel isolated". Lives alone out in the woods. Has friends he stays in contact with. Goes out to eat every Wednesday to the same place - feels comfortable there. Most recently seen by PCP - referring Provider. [facility] now at [place] in [facility]. Also reports seeing a therapist - "not very helpful". Reports multiple medication trials without any symptom relief. Also reports 36 treatments of [place] - "also not helpful". Stating "I want to feel better - I don't enjoy anything anymore". Willing to consider other option for treatment. Decreased interest and motivation. Taking medications as prescribed.  Energy levels lower. Active, does not have a regular exercise routine. Enjoys some usual interests and activities. Single. Lives alone with dog "Charlie". Family local. Appetite decreased - eating once a day. Weight gain.. Sleeps well most nights.  Averages 8 to 10 hours. Uses a CPAP machine. Focus and concentration difficulties. Completing tasks. Managing aspects of household. Disabled since - September of 2021. Denies SI or HI.  Denies AH or VH. Denies self harm. Using THC 3 times a week. Denies alcohol use x 4 months.  Previous medication trials:  Prozac, Celexa, Pristiq, Trintellix, Paxil, Adderall XR, Vyvanse, Lexapro, Zoloft, Cymbalta, Gabapentin, Methylphenidate ER 20, Wellbutrin XL, Effexor, Vraylar, Lithium Viibryd, Clonazepam, Ambien.    Review of Systems:  Review of Systems  Musculoskeletal:  Negative for gait problem.  Neurological:  Negative for tremors.  Psychiatric/Behavioral:         Please refer to HPI    Medications: I have reviewed the patient's current medications.  Current Outpatient Medications  Medication Sig Dispense Refill   Brexpiprazole (REXULTI) 0.5 MG TABS Take 1 tablet (0.5 mg total) by mouth daily. 30 tablet 2   amLODipine (NORVASC) 10 MG tablet Take 10 mg by mouth at bedtime.     aspirin EC 81 MG tablet Take by mouth.     carvedilol (COREG) 25 MG tablet Take 25 mg by mouth 2 (two) times daily.     HYDROcodone-acetaminophen (NORCO/VICODIN) 5-325 MG tablet Take 1 tablet by mouth every 6 (six) hours as needed. 15 tablet 0   hydrOXYzine (ATARAX) 25 MG tablet Take 1 tablet (25 mg total) by mouth 3 (three) times daily as needed. 30 tablet 0   naproxen sodium (ALEVE) 220 MG tablet Take by mouth.     naproxen sodium (ANAPROX) 220 MG tablet Take 440 mg by mouth every morning.  rosuvastatin (CRESTOR) 40 MG tablet Take 40 mg by mouth daily.     No current facility-administered medications for this visit.    Medication Side Effects: None  Allergies:  Allergies  Allergen Reactions   Isotretinoin Other (See Comments)   Other Nausea And Vomiting    Oysters Oysters   [employer]     Past Medical History:  Diagnosis Date   Anxiety    Hypertension     Past Medical History, Surgical  history, Social history, and Family history were reviewed and updated as appropriate.   Please see review of systems for further details on the patient's review from today.   Objective:   Physical Exam:  There were no vitals taken for this visit.  Physical Exam Constitutional:      General: He is not in acute distress. Musculoskeletal:        General: No deformity.  Neurological:     Mental Status: He is alert and oriented to person, place, and time.     Coordination: Coordination normal.  Psychiatric:        Attention and Perception: Attention and perception normal. He does not perceive auditory or visual hallucinations.        Mood and Affect: Mood normal. Mood is not anxious or depressed. Affect is not labile, blunt, angry or inappropriate.        Speech: Speech normal.        Behavior: Behavior normal.        Thought Content: Thought content normal. Thought content is not paranoid or delusional. Thought content does not include homicidal or suicidal ideation. Thought content does not include homicidal or suicidal plan.        Cognition and Memory: Cognition and memory normal.        Judgment: Judgment normal.     Comments: Insight intact     Lab Review:     Component Value Date/Time   NA 137 04/24/2018 0541   K 3.9 04/24/2018 0541   CL 102 04/24/2018 0541   CO2 27 04/24/2018 0541   GLUCOSE 124 (H) 04/24/2018 0541   BUN 12 04/24/2018 0541   CREATININE 1.10 04/24/2018 0541   CALCIUM 8.9 04/24/2018 0541   PROT 7.5 08/21/2015 1551   ALBUMIN 4.3 08/21/2015 1551   AST 25 08/21/2015 1551   ALT 38 08/21/2015 1551   ALKPHOS 80 08/21/2015 1551   BILITOT 0.5 08/21/2015 1551   GFRNONAA >60 04/24/2018 0541   GFRAA >60 04/24/2018 0541       Component Value Date/Time   WBC 7.2 04/24/2018 0541   RBC 4.78 04/24/2018 0541   HGB 13.9 04/24/2018 0541   HCT 42.7 04/24/2018 0541   PLT 206 04/24/2018 0541   MCV 89.3 04/24/2018 0541   MCH 29.1 04/24/2018 0541   MCHC 32.6  04/24/2018 0541   RDW 13.2 04/24/2018 0541   LYMPHSABS 1.2 04/24/2018 0541   MONOABS 0.6 04/24/2018 0541   EOSABS 0.0 04/24/2018 0541   BASOSABS 0.1 04/24/2018 0541    No results found for: "POCLITH", "LITHIUM"   No results found for: "PHENYTOIN", "PHENOBARB", "VALPROATE", "CBMZ"   .res Assessment: Plan:    Diagnoses and all orders for this visit:  Major depressive disorder, recurrent episode, moderate (HCC) -     Brexpiprazole (REXULTI) 0.5 MG TABS; Take 1 tablet (0.5 mg total) by mouth daily.  Generalized anxiety disorder -     hydrOXYzine (ATARAX) 25 MG tablet; Take 1 tablet (25 mg total) by mouth 3 (three)  times daily as needed.  Panic attacks -     hydrOXYzine (ATARAX) 25 MG tablet; Take 1 tablet (25 mg total) by mouth 3 (three) times daily as needed.  Attention deficit hyperactivity disorder (ADHD), combined type     Please see After Visit Summary for patient specific instructions.  No future appointments.   No orders of the defined types were placed in this encounter.   -------------------------------

## 2024-02-20 ENCOUNTER — Encounter: Payer: Self-pay | Admitting: Gastroenterology

## 2024-04-09 ENCOUNTER — Ambulatory Visit: Admitting: Gastroenterology

## 2024-04-09 VITALS — BP 142/82 | HR 64 | Ht 74.0 in | Wt 269.0 lb

## 2024-04-09 DIAGNOSIS — K602 Anal fissure, unspecified: Secondary | ICD-10-CM

## 2024-04-09 DIAGNOSIS — K5909 Other constipation: Secondary | ICD-10-CM | POA: Diagnosis not present

## 2024-04-09 DIAGNOSIS — K6289 Other specified diseases of anus and rectum: Secondary | ICD-10-CM

## 2024-04-09 MED ORDER — AMBULATORY NON FORMULARY MEDICATION: 1 refills | Status: AC

## 2024-04-09 NOTE — Progress Notes (Signed)
 [facility] Consult Note:  History: Ryan Bradley 04/09/2024  Referring provider: Loa Lamp, NP  Reason for consult/chief complaint: Rectal Pain (Patient states that he has had constant rectal pain since February. His PCP treated him for hemorrhoids and then he was sent to a surgeon who treated him for a fissure.  None of the treatments have helped.) and Constipation (He says that he has been constipated but he now takes 4 fiber gummies BID and a stool softener at night.)   Subjective  Prior history:  Patient reports having a colonoscopy about a year and a half ago with a GI group in [place] and recalls that it was normal.  He was told to come back in 5 years however. He had some genetic testing done due to strong family history of cancers including breast cancer, and it sounds like he may have tested positive for BRCA or other mutation.  Patient saw a surgeon in [place]  for his anorectal symptoms earlier this year   History of Present Illness   This is a very pleasant 48 year old man who is here seeking help for persistent anal rectal pain that has been going on since this past February.  He says in 2024 he went on a carnivore diet where he ate nothing but meat and eggs (little or no fiber), and he got terribly constipated.  Shortly after that he then took a cross-country trip with a friend and also says he ate poorly and got worse and constipation.  He had the acute onset of a sharp tearing anorectal pain with a BM that has not resolved since then.  Lindsey says he saw a careers adviser near where he lives in [place] , they told him there was a fissure and told him to pick up some over-the-counter ointment and things would heal on their own.  He was offered no follow-up or further guidelines.  He has been taking fiber Gummies on a stool softener every night and with that his bowels are moving easier.  He typically has a BM each morning and will not sit on the  toilet for long as tried to avoid straining.  He still has pain in the rectum or anal area present nearly all the time and it is worse with BMs.  He has seen some occasional blood on the paper but no frank rectal bleeding. He also notes having had a screening colonoscopy by GI group in [place]  about a year and a half ago (see above)   ROS:  Review of Systems  Constitutional:  Negative for appetite change and unexpected weight change.  HENT:  Negative for mouth sores and voice change.   Eyes:  Negative for pain and redness.  Respiratory:  Negative for cough and shortness of breath.   Cardiovascular:  Negative for chest pain and palpitations.  Genitourinary:  Negative for dysuria and hematuria.  Musculoskeletal:  Negative for arthralgias and myalgias.  Skin:  Negative for pallor and rash.  Neurological:  Negative for weakness and headaches.  Hematological:  Negative for adenopathy.     Past Medical History: Past Medical History:  Diagnosis Date   Anxiety    Hypertension      Past Surgical History: Past Surgical History:  Procedure Laterality Date   CARPAL TUNNEL RELEASE Right 11/14/2013   Procedure: RIGHT CARPAL TUNNEL RELEASE;  Surgeon: Franky JONELLE Curia, MD;  Location: [facility];  Service: Orthopedics;  Laterality: Right;   left knee Arthroscopy  2006 [facility] [place]   Reconstructive surgery  on left hand     2000 - [facility]   scalp surgery       Family History: No family history on file.  Social History: Social History   Socioeconomic History   Marital status: Single    Spouse name: Not on file   Number of children: Not on file   Years of education: Not on file   Highest education level: Not on file  Occupational History   Not on file  Tobacco Use   Smoking status: Former    Current packs/day: 0.00    Types: Cigarettes    Quit date: 07/27/2013    Years since quitting: 10.7   Smokeless tobacco: Never   Tobacco comments:    Smoked a  pack a a day  Substance and Sexual Activity   Alcohol use: Yes    Comment: Once a week - beer   Drug use: No   Sexual activity: Not on file  Other Topics Concern   Not on file  Social History Narrative   Not on file   Social Drivers of Health   Financial Resource Strain: Not on file  Food Insecurity: Not on file  Transportation Needs: Not on file  Physical Activity: Not on file  Stress: Not on file  Social Connections: Unknown (09/28/2021)   Received from [facility]   Social Network    Social Network: Not on file    Allergies: Allergies  Allergen Reactions   Isotretinoin Other (See Comments)   Other Nausea And Vomiting    Oysters [employer]     Outpatient Meds: Current Outpatient Medications  Medication Sig Dispense Refill   AMBULATORY NON FORMULARY MEDICATION Medication Name:  nitroglycerin  0.125% gel. You should apply a pea size amount to your rectum three times daily x 6-8 weeks. 30 g 1   amLODipine (NORVASC) 10 MG tablet Take 10 mg by mouth at bedtime.     Docusate Calcium (STOOL SOFTENER PO) Take 1 tablet by mouth at bedtime.     FIBER GUMMIES PO Take 4 tablets by mouth in the morning and at bedtime.     ibuprofen (ADVIL) 200 MG tablet Take 200 mg by mouth as needed.     losartan (COZAAR) 100 MG tablet Take 100 mg by mouth daily.     No current facility-administered medications for this visit.      ___________________________________________________________________ Objective   Exam:  BP (!) 142/82   Pulse 64   Ht 6' 2 (1.88 m)   Wt 269 lb (122 kg)   SpO2 97%   BMI 34.54 kg/m  Wt Readings from Last 3 Encounters:  04/09/24 269 lb (122 kg)  04/24/18 (!) 395 lb (179.2 kg)  08/21/15 (!) 350 lb (158.8 kg)   Exam chaperoned by our CMA Demetria General: Well-appearing Eyes: sclera anicteric, no redness ENT: oral mucosa moist without lesions, no cervical or supraclavicular lymphadenopathy CV: Regular without appreciable murmur, no JVD,  no peripheral edema Resp: clear to auscultation bilaterally, normal RR and effort noted GI: soft, no tenderness, with active bowel sounds. No guarding or palpable organomegaly noted. Skin; warm and dry, no rash or jaundice noted Neuro: awake, alert and oriented x 3. Normal gross motor function and fluent speech Perianal exam reveals prominent hemorrhoidal columns within the anal canal as well as a visible distal posterior fissure. Fissure is tender but the exam could be completed to reveal no additional DRE abnormalities  Encounter Diagnoses  Name Primary?   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  Reportedly up-to-date on colorectal cancer screening.  I could not give any further advice about the appropriate timeframe for a recall colonoscopy on him since I do not have his prior colonoscopy report nor the genetic testing upon which other advice has apparently been provided to him.  For his fissure, we will start MiraLAX half capful a day in addition to what he is doing now with fiber and stool softeners.  RectiCare ointment 3 times a day and as needed (particularly before and after BMs)  Nitroglycerin  ointment (compounded from [facility]) 3 times daily  Follow-up with me or our APP in 6 to 8 weeks (this patient will likely take months to heal given how long it has been there)  If it fails this treatment, we will refer him to colorectal surgery for consideration of Botox injection     Thank you for the courtesy of this consult.  Please call me with any questions or concerns.  Victory LITTIE Brand Bradley  CC: Referring provider noted above

## 2024-04-09 NOTE — Patient Instructions (Addendum)
 We have sent a prescription for nitroglycerin  0.125% gel to [facility]. You should apply a pea size amount to your rectum three times daily x 6-8 weeks.  [facility]'s information is below: Address: [address] [place], [place] [address]  [phone]  *Please DO NOT go directly from our office to pick up this medication! Give the pharmacy 1 day to process the prescription as this is compounded and takes time to make.    Please purchase the following medications over the counter and take as directed: Miralax, Start taking 1/2 capful  1x / day.Can adjust dose as needed based on response.   Recticare, apply a pea size amount to your rectum three times daily x 6-8 weeks.  Thank you for entrusting me with your care and for choosing [facility], Dr. Victory Brand III   _______________________________________________________  If your blood pressure at your visit was 140/90 or greater, please contact your primary care physician to follow up on this.  _______________________________________________________  If you are age 48 or older, your body mass index should be between 23-30. Your Body mass index is 34.54 kg/m. If this is out of the aforementioned range listed, please consider follow up with your Primary Care Provider.  If you are age 66 or younger, your body mass index should be between 19-25. Your Body mass index is 34.54 kg/m. If this is out of the aformentioned range listed, please consider follow up with your Primary Care Provider.   ________________________________________________________  The [facility] providers would like to encourage you to use MYCHART to communicate with providers for non-urgent requests or questions.  Due to long hold times on the telephone, sending your provider a message by [facility] may be a faster and more efficient way to get a response.  Please allow 48 business hours for a response.  Please remember that this is for  non-urgent requests.  _______________________________________________________  [facility] is using a team-based approach to care.  Your team is made up of your doctor and two to three APPS. Our APPS (Nurse Practitioners and Physician Assistants) work with your physician to ensure care continuity for you. They are fully qualified to address your health concerns and develop a treatment plan. They communicate directly with your gastroenterologist to care for you. Seeing the Advanced Practice Practitioners on your physician's team can help you by facilitating care more promptly, often allowing for earlier appointments, access to diagnostic testing, procedures, and other specialty referrals.

## 2024-06-12 ENCOUNTER — Ambulatory Visit: Admitting: Gastroenterology

## 2024-06-12 ENCOUNTER — Encounter: Payer: Self-pay | Admitting: Gastroenterology

## 2024-06-12 VITALS — BP 132/82 | HR 65 | Ht 74.0 in | Wt 292.0 lb

## 2024-06-12 DIAGNOSIS — K602 Anal fissure, unspecified: Secondary | ICD-10-CM | POA: Diagnosis not present

## 2024-06-12 MED ORDER — AMBULATORY NON FORMULARY MEDICATION: 0 refills | Status: AC

## 2024-06-12 NOTE — Progress Notes (Signed)
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 Legrand Prom, nitroglycerin  and lidocaine  ointment prescribed  Subjective  HPI:  Pain significantly decreased since initial office visit.  Using the nitroglycerin  and RectiCare ointment just once daily in the morning.  Daily MiraLAX is giving him 1 soft stool per day without bleeding.  No abdominal pain.  Appetite good weight stable  ROS: Cardiovascular:  no chest pain Respiratory: no dyspnea  The patient's Past Medical, Family and Social History were reviewed and are on file in the EMR. Past Medical History:  Diagnosis Date   Anxiety    Hypertension     Past Surgical History:  Procedure Laterality Date   CARPAL TUNNEL RELEASE Right 11/14/2013   Procedure: RIGHT CARPAL TUNNEL RELEASE;  Surgeon: Franky JONELLE Curia, MD;  Location: [facility];  Service: Orthopedics;  Laterality: Right;   left knee Arthroscopy     2006 [facility] [place]   Reconstructive surgery  on left hand     2000 - [facility]   scalp surgery      Objective:  Med list reviewed Current Medications[1]   Vital signs in last 24 hrs: Vitals:   06/12/24 0913  BP: 132/82  Pulse: 65  SpO2: 95%   Wt Readings from Last 3 Encounters:  06/12/24 292 lb (132.5 kg)  04/09/24 269 lb (122 kg)  04/24/18 (!) 395 lb (179.2 kg)    Physical Exam  Abdomen soft and nontender Posterior anal fissure present on DRE but less tender and less prominent than on last exam  Labs:   ___________________________________________ Radiologic studies:   ____________________________________________ Other:   _____________________________________________ Assessment & Plan  Assessment: Encounter Diagnosis  Name Primary?   Anal fissure Yes    Slowly improving on therapy of MiraLAX, nitroglycerin  and RectiCare  ointment  Will probably take another couple of months to heal given how deep and chronic it was.  However, he needs to maximize medical therapy by increasing the use of both ointments to at least twice a day (3 times if he can do it).  If not continuing to improve at next follow-up in 6 to 8 weeks, refer to colorectal surgery.    I spent total of 20 minutes in both face-to-face (15 minutes interview/exam) and non-face-to-face (5 minutes chart review, care coordination, documentation)  activities, excluding procedures performed, for the visit on the date of this encounter.    Victory LITTIE Legrand III     [1]  Current Outpatient Medications:    AMBULATORY NON FORMULARY MEDICATION, Medication Name:  nitroglycerin  0.125% gel. You should apply a pea size amount to your rectum three times daily x 6-8 weeks., Disp: 30 g, Rfl: 1   amLODipine (NORVASC) 10 MG tablet, Take 10 mg by mouth at bedtime., Disp: , Rfl:    Docusate Calcium (STOOL SOFTENER PO), Take 1 tablet by mouth at bedtime., Disp: , Rfl:    FIBER GUMMIES PO, Take 4 tablets by mouth in the morning and at bedtime., Disp: , Rfl:    ibuprofen (ADVIL) 200 MG tablet, Take 200 mg by mouth as needed., Disp: , Rfl:    losartan (COZAAR) 100 MG tablet, Take 100 mg by mouth daily., Disp: , Rfl:   "

## 2024-06-12 NOTE — Patient Instructions (Addendum)
 We have sent a prescription for nitroglycerin  0.125% gel to [facility]. You should apply a pea size amount to your rectum three times daily x 6-8 weeks.  [facility]'s information is below: Address: [address], [place], [place] [address]  [phone]  *Please DO NOT go directly from our office to pick up this medication! Give the pharmacy 1 day to process the prescription as this is compounded and takes time to make.  Thank you for trusting me with your gastrointestinal care!    Dr. Victory Legrand DOUGLAS [facility]

## 2024-08-06 ENCOUNTER — Ambulatory Visit: Admitting: Gastroenterology
